# Patient Record
Sex: Male | Born: 1970 | Race: Black or African American | Hispanic: No | Marital: Married
Health system: Southern US, Community
[De-identification: ages and names within clinical notes are randomized; demographics above are authoritative.]

## PROBLEM LIST (undated history)

## (undated) DIAGNOSIS — M5410 Radiculopathy, site unspecified: Secondary | ICD-10-CM

## (undated) DIAGNOSIS — F411 Generalized anxiety disorder: Secondary | ICD-10-CM

---

## 2012-05-01 DIAGNOSIS — I471 Supraventricular tachycardia: Secondary | ICD-10-CM

## 2012-05-01 HISTORY — DX: Supraventricular tachycardia: I47.1

## 2017-05-01 HISTORY — PX: ROTATOR CUFF REPAIR: SHX139

## 2020-04-11 ENCOUNTER — Emergency Department (HOSPITAL_COMMUNITY)
Admission: EM | Admit: 2020-04-11 | Discharge: 2020-04-11 | Disposition: A | Discharge status: Home / Self Care | Attending: Emergency Medicine | Admitting: Emergency Medicine

## 2020-04-11 ENCOUNTER — Other Ambulatory Visit: Payer: Self-pay

## 2020-04-11 ENCOUNTER — Emergency Department (HOSPITAL_COMMUNITY)

## 2020-04-11 ENCOUNTER — Encounter (HOSPITAL_COMMUNITY): Payer: Self-pay

## 2020-04-11 DIAGNOSIS — G43109 Migraine with aura, not intractable, without status migrainosus: Secondary | ICD-10-CM | POA: Diagnosis not present

## 2020-04-11 DIAGNOSIS — Z20822 Contact with and (suspected) exposure to covid-19: Secondary | ICD-10-CM | POA: Insufficient documentation

## 2020-04-11 DIAGNOSIS — R7401 Elevation of levels of liver transaminase levels: Secondary | ICD-10-CM | POA: Diagnosis not present

## 2020-04-11 DIAGNOSIS — R030 Elevated blood-pressure reading, without diagnosis of hypertension: Secondary | ICD-10-CM | POA: Insufficient documentation

## 2020-04-11 DIAGNOSIS — R531 Weakness: Secondary | ICD-10-CM | POA: Insufficient documentation

## 2020-04-11 DIAGNOSIS — R202 Paresthesia of skin: Secondary | ICD-10-CM | POA: Insufficient documentation

## 2020-04-11 DIAGNOSIS — I639 Cerebral infarction, unspecified: Secondary | ICD-10-CM | POA: Insufficient documentation

## 2020-04-11 HISTORY — DX: Generalized anxiety disorder: F41.1

## 2020-04-11 HISTORY — DX: Radiculopathy, site unspecified: M54.10

## 2020-04-11 LAB — RESP PANEL BY RT-PCR (FLU A&B, COVID) ARPGX2
Influenza A by PCR: NEGATIVE
Influenza B by PCR: NEGATIVE
SARS Coronavirus 2 by RT PCR: NEGATIVE

## 2020-04-11 LAB — CBG MONITORING, ED: Glucose-Capillary: 116 mg/dL — ABNORMAL HIGH (ref 70–99)

## 2020-04-11 LAB — DIFFERENTIAL
Abs Immature Granulocytes: 0.01 10*3/uL (ref 0.00–0.07)
Basophils Absolute: 0.1 10*3/uL (ref 0.0–0.1)
Basophils Relative: 1 %
Eosinophils Absolute: 0 10*3/uL (ref 0.0–0.5)
Eosinophils Relative: 0 %
Immature Granulocytes: 0 %
Lymphocytes Relative: 60 %
Lymphs Abs: 3.3 10*3/uL (ref 0.7–4.0)
Monocytes Absolute: 0.5 10*3/uL (ref 0.1–1.0)
Monocytes Relative: 9 %
Neutro Abs: 1.7 10*3/uL (ref 1.7–7.7)
Neutrophils Relative %: 30 %

## 2020-04-11 LAB — COMPREHENSIVE METABOLIC PANEL
ALT: 73 U/L — ABNORMAL HIGH (ref 0–44)
AST: 46 U/L — ABNORMAL HIGH (ref 15–41)
Albumin: 4.5 g/dL (ref 3.5–5.0)
Alkaline Phosphatase: 45 U/L (ref 38–126)
Anion gap: 10 (ref 5–15)
BUN: 8 mg/dL (ref 6–20)
CO2: 26 mmol/L (ref 22–32)
Calcium: 9.4 mg/dL (ref 8.9–10.3)
Chloride: 101 mmol/L (ref 98–111)
Creatinine, Ser: 1.18 mg/dL (ref 0.61–1.24)
GFR, Estimated: >60 mL/min (ref >60)
Glucose, Bld: 118 mg/dL — ABNORMAL HIGH (ref 70–99)
Potassium: 3.9 mmol/L (ref 3.5–5.1)
Sodium: 137 mmol/L (ref 135–145)
Total Bilirubin: 0.7 mg/dL (ref 0.3–1.2)
Total Protein: 7.7 g/dL (ref 6.5–8.1)

## 2020-04-11 LAB — CBC
HCT: 44.7 % (ref 39.0–52.0)
Hemoglobin: 15 g/dL (ref 13.0–17.0)
MCH: 29.8 pg (ref 26.0–34.0)
MCHC: 33.6 g/dL (ref 30.0–36.0)
MCV: 88.7 fL (ref 80.0–100.0)
Platelets: 265 10*3/uL (ref 150–400)
RBC: 5.04 MIL/uL (ref 4.22–5.81)
RDW: 12.7 % (ref 11.5–15.5)
WBC: 5.6 10*3/uL (ref 4.0–10.5)
nRBC: 0 % (ref 0.0–0.2)

## 2020-04-11 LAB — I-STAT CHEM 8, ED
BUN: 9 mg/dL (ref 6–20)
Calcium, Ion: 1 mmol/L — ABNORMAL LOW (ref 1.15–1.40)
Chloride: 102 mmol/L (ref 98–111)
Creatinine, Ser: 1 mg/dL (ref 0.61–1.24)
Glucose, Bld: 115 mg/dL — ABNORMAL HIGH (ref 70–99)
HCT: 48 % (ref 39.0–52.0)
Hemoglobin: 16.3 g/dL (ref 13.0–17.0)
Potassium: 3.7 mmol/L (ref 3.5–5.1)
Sodium: 139 mmol/L (ref 135–145)
TCO2: 24 mmol/L (ref 22–32)

## 2020-04-11 LAB — PROTIME-INR
INR: 0.9 (ref 0.8–1.2)
Prothrombin Time: 11.9 seconds (ref 11.4–15.2)

## 2020-04-11 LAB — APTT: aPTT: 28 seconds (ref 24–36)

## 2020-04-11 MED ORDER — METOCLOPRAMIDE HCL 5 MG/ML IJ SOLN
10.0000 mg | Freq: Once | INTRAMUSCULAR | Status: AC
  Administered 2020-04-11: 10 mg via INTRAVENOUS
  Filled 2020-04-11: qty 2

## 2020-04-11 MED ORDER — SODIUM CHLORIDE 0.9 % IV BOLUS: 1000.0000 mL | Freq: Once | INTRAVENOUS | Status: DC

## 2020-04-11 MED ORDER — DIPHENHYDRAMINE HCL 50 MG/ML IJ SOLN
50.0000 mg | Freq: Once | INTRAMUSCULAR | Status: AC
  Administered 2020-04-11: 50 mg via INTRAVENOUS
  Filled 2020-04-11: qty 1

## 2020-04-11 MED ORDER — SODIUM CHLORIDE 0.9 % IV SOLN: INTRAVENOUS | Status: DC

## 2020-04-11 MED ORDER — SODIUM CHLORIDE 0.9% FLUSH
3.0000 mL | Freq: Once | INTRAVENOUS | Status: AC
  Administered 2020-04-11: 3 mL via INTRAVENOUS

## 2020-04-11 MED ORDER — LORAZEPAM 2 MG/ML IJ SOLN
1.0000 mg | Freq: Once | INTRAMUSCULAR | Status: DC
  Filled 2020-04-11: qty 1

## 2020-04-11 MED ORDER — KETOROLAC TROMETHAMINE 30 MG/ML IJ SOLN
30.0000 mg | Freq: Once | INTRAMUSCULAR | Status: AC
  Administered 2020-04-11: 30 mg via INTRAVENOUS
  Filled 2020-04-11: qty 1

## 2020-04-11 NOTE — ED Notes (Signed)
Patient verbalized understanding of discharge instructions. Opportunity for questions and answers.  

## 2020-04-11 NOTE — ED Notes (Signed)
Pt felt hot, flushed and was anxious after getting migraine cocktail. EDP made aware, order to ativan. When this RN walked in pt stated he was feeling much better and was ready to go home. EDP wants to monitor pt for another 30 mins.

## 2020-04-11 NOTE — ED Notes (Signed)
Patient transported to MRI 

## 2020-04-11 NOTE — Consult Note (Addendum)
Referring Physician: EMS/ER    Reason for Consult: code stroke  HPI: Matthew Russo is an 49 y.o. male with no PMH, presents with sudden onset of peri-oral numbness and tingling, followed by left arm/leg numbness. He states he first noted this around 10:30 am lasting only few seconds to minutes, but then it came and went a few more times, completely stopping between events. Then, since noon the last event has not stopped, so he went to an urgent care where EMS was called for stroke symptoms. On exam he still has very mild symptoms of left side numbness. MRI brain neg for stroke.   Date last known well: 04/11/20 Time last known well: 1200  tPA Given: mild symptoms; by time wk up done, he was outside of 3.5h time window  Past Medical History No past medical history on file.  Surgical History none  Family History  No family history on file.  Social History:   has no history on file for tobacco use, alcohol use, and drug use.  Allergies:  Not on File  Home Medications:  (Not in a hospital admission)   Hospital Medications . sodium chloride flush  3 mL Intravenous Once    ROS:  History obtained from pt  General ROS: negative for - chills, fatigue, fever, night sweats, weight gain or weight loss Psychological ROS: negative for - behavioral disorder, hallucinations, memory difficulties, mood swings or suicidal ideation Ophthalmic ROS: negative for - blurry vision, double vision, eye pain or loss of vision ENT ROS: negative for - epistaxis, nasal discharge, oral lesions, sore throat, tinnitus or vertigo Allergy and Immunology ROS: negative for - hives or itchy/watery eyes Hematological and Lymphatic ROS: negative for - bleeding problems, bruising or swollen lymph nodes Endocrine ROS: negative for - galactorrhea, hair pattern changes, polydipsia/polyuria or temperature intolerance Respiratory ROS: negative for - cough, hemoptysis, shortness of breath or wheezing Cardiovascular ROS:  negative for - chest pain, dyspnea on exertion, edema or irregular heartbeat Gastrointestinal ROS: negative for - abdominal pain, diarrhea, hematemesis, nausea/vomiting or stool incontinence Genito-Urinary ROS: negative for - dysuria, hematuria, incontinence or urinary frequency/urgency Musculoskeletal ROS: negative for - joint swelling or muscular weakness Neurological ROS: as noted in HPI Dermatological ROS: negative for rash and skin lesion changes   Physical Examination:  There were no vitals filed for this visit.  General: Appears well-developed; mild distress Psych: Affect appropriate to situation Eyes: No scleral injection HENT: No OP obstrucion Head: Normocephalic.  Cardiovascular: Normal rate and regular rhythm. Respiratory: Effort normal and breath sounds normal to anterior ascultation GI: Soft.  No distension. There is no tenderness.  Skin: WDI    Neurological Examination Mental Status: Alert, oriented, thought content appropriate.  Speech fluent without evidence of aphasia. Able to follow 3 step commands without difficulty. Cranial Nerves: II: Visual fields grossly normal,  III,IV, VI: ptosis not present, extra-ocular motions intact bilaterally, pupils equal, round, reactive to light and accommodation V,VII: smile symmetric, facial light touch sensation abnormal on left. VIII: hearing normal bilaterally IX,X: uvula rises symmetrically XI: bilateral shoulder shrug XII: midline tongue extension Motor: Right : Upper extremity   5/5    Left:     Upper extremity   5/5  Lower extremity   5/5     Lower extremity   5/5 Tone and bulk:normal tone throughout; no atrophy noted Sensory: Decreased on left arm/leg to light touch and cold temp Deep Tendon Reflexes: 2+ and symmetric throughout Plantars: Right: downgoing   Left: downgoing Cerebellar: normal  finger-to-nose, normal rapid alternating movements and normal heel-to-shin test Gait: did not test NIHSS 1a Level of  Conscious.:  1b LOC Questions:  1c LOC Commands:  2 Best Gaze:  3 Visual:  4 Facial Palsy:  5a Motor Arm - Left: 5b Motor Arm - Right:  6a Motor Leg - Left:  6b Motor Leg - Right:  7 Limb Ataxia:  8 Sensory: 1 9 Best Language:  10 Dysarthria:  11 Extinct. and Inatten.:  TOTAL: 1    LABORATORY STUDIES:  Basic Metabolic Panel: Recent Labs  Lab 04/11/20 1506  NA 139  K 3.7  CL 102  GLUCOSE 115*  BUN 9  CREATININE 1.00    Liver Function Tests: No results for input(s): AST, ALT, ALKPHOS, BILITOT, PROT, ALBUMIN in the last 168 hours. No results for input(s): LIPASE, AMYLASE in the last 168 hours. No results for input(s): AMMONIA in the last 168 hours.  CBC: Recent Labs  Lab 04/11/20 1506  HGB 16.3  HCT 48.0    Cardiac Enzymes: No results for input(s): CKTOTAL, CKMB, CKMBINDEX, TROPONINI in the last 168 hours.  BNP: Invalid input(s): POCBNP  CBG: Recent Labs  Lab 04/11/20 1500  GLUCAP 116*    Microbiology:   Coagulation Studies: No results for input(s): LABPROT, INR in the last 72 hours.  Urinalysis: No results for input(s): COLORURINE, LABSPEC, PHURINE, GLUCOSEU, HGBUR, BILIRUBINUR, KETONESUR, PROTEINUR, UROBILINOGEN, NITRITE, LEUKOCYTESUR in the last 168 hours.  Invalid input(s): APPERANCEUR  Lipid Panel:  No results found for: CHOL, TRIG, HDL, CHOLHDL, VLDL, LDLCALC  HgbA1C:  No results found for: HGBA1C  Urine Drug Screen:  No results found for: LABOPIA, COCAINSCRNUR, LABBENZ, AMPHETMU, THCU, LABBARB   Alcohol Level:  No results for input(s): ETH in the last 168 hours.  Miscellaneous labs:  ECG - SR rate   BPM. (See cardiology reading for complete details)   IMAGING: CT HEAD CODE STROKE WO CONTRAST  Result Date: 04/11/2020 CLINICAL DATA:  Code stroke. 49 year old male with left side weakness and numbness. EXAM: CT HEAD WITHOUT CONTRAST TECHNIQUE: Contiguous axial images were obtained from the base of the skull through the vertex  without intravenous contrast. COMPARISON:  None. FINDINGS: Brain: Mild symmetric dystrophic calcifications in the basal ganglia. Normal cerebral volume. No midline shift, ventriculomegaly, mass effect, evidence of mass lesion, intracranial hemorrhage or evidence of cortically based acute infarction. Gray-white matter differentiation is within normal limits throughout the brain. Vascular: Mild Calcified atherosclerosis at the skull base. No suspicious intracranial vascular hyperdensity. Skull: Negative. Sinuses/Orbits: Visualized paranasal sinuses and mastoids are clear. Other: Visualized orbits and scalp soft tissues are within normal limits. ASPECTS [facility] Stroke Program Early CT Score) Total score (0-10 with 10 being normal): 10 IMPRESSION: 1. Normal noncontrast CT appearance of the brain.  ASPECTS 10. 2. These results were communicated to Dr. Amada Jupiter at 3:15 pm on 04/11/2020 by text page via the [facility] messaging system. Electronically Signed   By: Odessa Fleming M.D.   On: 04/11/2020 15:16   Assessment:  Mr. Matthew Russo is a 49 y.o. male with no medical history presenting with stuttering symptoms of left side numbness. He did not receive IV t-PA due to mild symptoms and outside of time window by time work up done. Stat MRI showed no acute stroke. MRA head and neck wnl. Ddx is most likely a complex migraine.   1. Transient neurologic symptoms 2. Complex migraine 3. Elevated BP without dx of HTN- monitor, may need medication. Needs to f/u with PCP  Plan:  IV Migraine cocktail: Toradol 30mg , Benadryl 50mg , Reglan 10mg   If no relief, may repeat cocktail in 6h  IVF @ 75cc/h  Frequent neuro checks  Fall Precautions    Attending Neurologist's note to follow  Desiree Metzger-Cihelka, ARNP-C, ANVP-BC Pager: [phone]  I have seen the patient reviewed the above note.  With positive symptoms and history of migraine, I think that this is more likely to be complicated migraine aura without  headache versus transient ischemic attack.  Even if this were to represent transient ischemic attack, his ABCD two score is one.  The my index of suspicion for TIA is not high, baby aspirin is a relatively low risk intervention and therefore I would favor starting baby aspirin.  MRI was initially recommended, available at the time of finalizing this note.  No further recommendations at this time.  , MD [facility] [phone]  If 7pm- 7am, please page neurology on call as listed in AMION.

## 2020-04-11 NOTE — ED Provider Notes (Signed)
[facility]   CSN: [id number] Arrival date & time: 04/11/20  1458  An emergency department physician performed an initial assessment on this suspected stroke patient at [id number].  History Chief Complaint  Patient presents with  . Code Stroke    Matthew Russo is a 49 y.o. male with history of anxiety and SVT who presents as a code stroke.  Patient states that around noon (about 3 hours ago), he started experiencing perioral, tongue, left facial, LUE, and LLE numbness, tingling, and weakness. Tingling worse around his mouth and his left fingertips.  The symptoms seem to wax and wane without any clear aggravating or alleviating factors. He states the symptoms returned about 6 times but have resolved at this time. Airway intact, GCS 15.  The history is provided by the patient and the EMS personnel.  Neurologic Problem This is a new problem. The current episode started 3 to 5 hours ago. The problem has not changed since onset.Pertinent negatives include no chest pain, no abdominal pain, no headaches and no shortness of breath. Nothing aggravates the symptoms. Nothing relieves the symptoms. He has tried nothing for the symptoms.       Past Medical History:  Diagnosis Date  . Generalized anxiety disorder   . Radiculopathy    right shoulder  . SVT (supraventricular tachycardia) (HCC) 2014    There are no problems to display for this patient.   Past Surgical History:  Procedure Laterality Date  . ROTATOR CUFF REPAIR Left 2019       No family history on file.     Home Medications Prior to Admission medications   Not on File    Allergies    Patient has no allergy information on record.  Review of Systems   Review of Systems  Respiratory: Negative for shortness of breath.   Cardiovascular: Negative for chest pain.  Gastrointestinal: Negative for abdominal pain.  Neurological: Positive for weakness and numbness. Negative  for headaches.  All other systems reviewed and are negative.   Physical Exam Updated Vital Signs BP (!) 146/88 (BP Location: Right Arm)   Pulse 70   Temp 98 F (36.7 C) (Oral)   Resp 19   Ht 5\' 10"  (1.778 m)   Wt 112.6 kg   SpO2 99%   BMI 35.62 kg/m   Physical Exam Vitals and nursing note reviewed.  Constitutional:      General: He is not in acute distress.    Appearance: Normal appearance. He is well-developed and well-nourished. He is not ill-appearing or toxic-appearing.  HENT:     Head: Normocephalic and atraumatic.     Right Ear: External ear normal.     Left Ear: External ear normal.     Nose: Nose normal. No rhinorrhea.  Eyes:     Extraocular Movements: Extraocular movements intact.     Conjunctiva/sclera: Conjunctivae normal.     Pupils: Pupils are equal, round, and reactive to light.  Cardiovascular:     Rate and Rhythm: Normal rate and regular rhythm.     Heart sounds: No murmur heard.   Pulmonary:     Effort: Pulmonary effort is normal. No respiratory distress.     Breath sounds: Normal breath sounds.  Abdominal:     Palpations: Abdomen is soft.     Tenderness: There is no abdominal tenderness.  Musculoskeletal:        General: No edema.     Cervical back: Neck supple.  Skin:  General: Skin is warm and dry.  Neurological:     Mental Status: He is alert and oriented to person, place, and time. Mental status is at baseline.     Cranial Nerves: No cranial nerve deficit.     Sensory: No sensory deficit.     Motor: No weakness.  Psychiatric:        Mood and Affect: Mood and affect and mood normal.        Behavior: Behavior normal.     ED Results / Procedures / Treatments   Labs (all labs ordered are listed, but only abnormal results are displayed) Labs Reviewed  COMPREHENSIVE METABOLIC PANEL - Abnormal; Notable for the following components:      Result Value   Glucose, Bld 118 (*)    AST 46 (*)    ALT 73 (*)    All other components within  normal limits  I-STAT CHEM 8, ED - Abnormal; Notable for the following components:   Glucose, Bld 115 (*)    Calcium, Ion 1.00 (*)    All other components within normal limits  CBG MONITORING, ED - Abnormal; Notable for the following components:   Glucose-Capillary 116 (*)    All other components within normal limits  RESP PANEL BY RT-PCR (FLU A&B, COVID) ARPGX2  PROTIME-INR  APTT  CBC  DIFFERENTIAL    EKG None  Radiology MR ANGIO HEAD WO CONTRAST  Result Date: 04/11/2020 CLINICAL DATA:  49 year old male code stroke presentation today. Left side weakness and numbness. EXAM: MRA HEAD WITHOUT CONTRAST TECHNIQUE: Angiographic images of the Circle of Willis were obtained using MRA technique without intravenous contrast. COMPARISON:  Brain MRI and neck MRA today reported separately. FINDINGS: Antegrade flow in the posterior circulation with codominant distal vertebral arteries. Normal PICA origins. No distal vertebral or basilar stenosis. Normal SCA and PCA origins. Posterior communicating arteries are diminutive or absent. Bilateral PCA branches are within normal limits. Antegrade flow in both ICA siphons. No siphon stenosis. Ophthalmic artery origins appear normal. Patent carotid termini. Normal MCA and ACA origins. Diminutive or absent anterior communicating artery. Mildly tortuous ACA branches otherwise appear normal. Bilateral MCA M1 segments and MCA bifurcations are patent without stenosis. Visible bilateral MCA branches are within normal limits. IMPRESSION: Negative intracranial MRA. Electronically Signed   By: Odessa Fleming M.D.   On: 04/11/2020 17:34   MR ANGIO NECK WO CONTRAST  Result Date: 04/11/2020 CLINICAL DATA:  49 year old male code stroke presentation today. Left side weakness and numbness. EXAM: MRA NECK WITHOUT CONTRAST TECHNIQUE: Angiographic images of the neck were obtained using MRA technique without intravenous contrast. Carotid stenosis measurements (when applicable) are  obtained utilizing NASCET criteria, using the distal internal carotid diameter as the denominator. COMPARISON:  Brain MRI today, head CT reported separately. FINDINGS: Time-of-flight neck MRA imaging. Aortic arch is included with a slightly bovine arch configuration incidentally noted (normal variant). Antegrade flow in the right CCA. Right carotid bifurcation appears normal. Antegrade flow in the cervical right ICA to the skull base without evidence of stenosis. Antegrade flow in the left CCA, left carotid bifurcation, and cervical left ICA which appear within normal limits. Proximal subclavian arteries and both cervical vertebral arteries demonstrate antegrade flow without evidence of stenosis to the skull base. Codominant vertebral arteries. IMPRESSION: Negative Neck MRA. Electronically Signed   By: Odessa Fleming M.D.   On: 04/11/2020 17:31   MR BRAIN WO CONTRAST  Result Date: 04/11/2020 CLINICAL DATA:  49 year old male code stroke presentation today. Left side  weakness and numbness. EXAM: MRI HEAD WITHOUT CONTRAST TECHNIQUE: Multiplanar, multiecho pulse sequences of the brain and surrounding structures were obtained without intravenous contrast. COMPARISON:  Plain head CT 1508 hours today. FINDINGS: Brain: No restricted diffusion to suggest acute infarction. No midline shift, mass effect, evidence of mass lesion, ventriculomegaly, extra-axial collection or acute intracranial hemorrhage. Cervicomedullary junction and pituitary are within normal limits. Wallace Cullens and white matter signal is within normal limits for age throughout the brain. No cortical encephalomalacia or chronic cerebral blood products identified. Deep gray nuclei, brainstem and cerebellum appear normal. Vascular: Major intracranial vascular flow voids are preserved. See also MRA reported separately today. Skull and upper cervical spine: Negative. Sinuses/Orbits: Negative. Other: Trace bilateral mastoid air cell fluid. Negative visible nasopharynx.  Grossly normal visible internal auditory structures. Visible scalp and face appear negative. IMPRESSION: No acute intracranial abnormality, normal for age noncontrast MRI appearance of the brain. Electronically Signed   By: Odessa Fleming M.D.   On: 04/11/2020 17:29   CT HEAD CODE STROKE WO CONTRAST  Result Date: 04/11/2020 CLINICAL DATA:  Code stroke. 49 year old male with left side weakness and numbness. EXAM: CT HEAD WITHOUT CONTRAST TECHNIQUE: Contiguous axial images were obtained from the base of the skull through the vertex without intravenous contrast. COMPARISON:  None. FINDINGS: Brain: Mild symmetric dystrophic calcifications in the basal ganglia. Normal cerebral volume. No midline shift, ventriculomegaly, mass effect, evidence of mass lesion, intracranial hemorrhage or evidence of cortically based acute infarction. Gray-white matter differentiation is within normal limits throughout the brain. Vascular: Mild Calcified atherosclerosis at the skull base. No suspicious intracranial vascular hyperdensity. Skull: Negative. Sinuses/Orbits: Visualized paranasal sinuses and mastoids are clear. Other: Visualized orbits and scalp soft tissues are within normal limits. ASPECTS [facility] Stroke Program Early CT Score) Total score (0-10 with 10 being normal): 10 IMPRESSION: 1. Normal noncontrast CT appearance of the brain.  ASPECTS 10. 2. These results were communicated to Dr. Amada Jupiter at 3:15 pm on 04/11/2020 by text page via the [facility] messaging system. Electronically Signed   By: Odessa Fleming M.D.   On: 04/11/2020 15:16    Procedures Procedures (including critical care time)  Medications Ordered in ED Medications  0.9 %  sodium chloride infusion ( Intravenous Stopped 04/11/20 1950)  LORazepam (ATIVAN) injection 1 mg (1 mg Intravenous Not Given 04/11/20 1903)  sodium chloride 0.9 % bolus 1,000 mL (1,000 mLs Intravenous Refused 04/11/20 1934)  sodium chloride flush (NS) 0.9 % injection 3 mL (3 mLs Intravenous  Given 04/11/20 1523)  metoCLOPramide (REGLAN) injection 10 mg (10 mg Intravenous Given 04/11/20 1835)  ketorolac (TORADOL) 30 MG/ML injection 30 mg (30 mg Intravenous Given 04/11/20 1837)  diphenhydrAMINE (BENADRYL) injection 50 mg (50 mg Intravenous Given 04/11/20 1833)    ED Course  I have reviewed the triage vital signs and the nursing notes.  Pertinent labs & imaging results that were available during my care of the patient were reviewed by me and considered in my medical decision making (see chart for details).    MDM Rules/Calculators/A&P                          MDM: Matthew Russo is a 49 y.o. male who presents as Code Stroke as per above. I have reviewed the nursing documentation for past medical history, family history, and social history. Pertinent previous records reviewed. He is awake, alert. Mildly hypertensive to 167/96 but otherwise HDS. Afebrile. Physical exam is most notable for normal neuro exam.  Labs: glucose 116, slight transaminitis, Covid negative. EKG: NSR, nonspecific T wave changes. QTc, PR, and QRS within appropriate limits. No signs of acute ischemia, infarct, or significant electrical abnormalities. No STEMI, ST depressions, or significant T wave inversions. No evidence of a High-Grade Conduction Block, WPW, Brugada Sign, ARVC, DeWinters T Waves, or Wellens Waves. Imaging: CT head demonstrating no acute intracranial normality.  MRA head/neck and MRI brain with no acute findings. Consults: Neuro/Dr. Amada Jupiter Tx: Migraine cocktail with 50 mg IV Benadryl, 10 mg IV Reglan, 30 mg IV Toradol, and 1 L LR bolus.  Patient ordered 1 mg Ativan but never given.  Differential Dx: I am most concerned for migraine with aura. Given history, physical exam, and work-up, I do not think he has ischemic stroke, hemorrhagic stroke, vertebral/carotid artery dissection, electrolyte abnormality, or trauma.  MDM: Matthew Russo is a 49 y.o. male presents a code stroke due to perioral,  tongue, and left hemibody weakness, numbness, and tingling.  Still had mild symptoms when patient arrived to the CT scanner, neurology at bedside.  However, on reassessment and exam by me after CT head, patient stated that his symptoms had resolved.  EKG with nonspecific TW changes but not having chest pain, shortness of breath, or other symptoms concerning for cardiac etiology and thus do not feel that further work-up is indicated at this time.  Low concern for for stroke or TIA at this time.  Discussed patient's care with neurology who recommended migraine cocktail and discharging home on 81 mg daily ASA.  Per neurology, aspirin is a low risk intervention but still low concern for TIA.  Patient stable for discharge home, discussed strict return precautions, follow-up with neurology, and follow-up with PCP.  Patient and significant other bedside at bedside voiced understanding.  Patient was given migraine cocktail and had an episode of flushing.  Low concern for significant drug reaction, allergy, anaphylaxis at this time.  1 mg Ativan was ordered for the patient but never given.  Patient observed for an additional 30 minutes and on reassessment, overall well-appearing, asking to go home.  I felt this is reasonable and do not feel that further work-up is indicated this time.  Discharge stable condition.  Strict return precautions provided.  Encouraged him to follow-up with his PCP and neurology on outpatient basis.  All questions answered.  The plan for this patient was discussed with Dr. Silverio Lay, who voiced agreement and who oversaw evaluation and treatment of this patient.   Final Clinical Impression(s) / ED Diagnoses Final diagnoses:  Migraine with aura and without status migrainosus, not intractable    Rx / DC Orders ED Discharge Orders    None       Kelise Kuch, MD 04/12/20 0001    Charlynne Pander, MD 04/17/20 2019

## 2020-04-11 NOTE — Discharge Instructions (Signed)
Thank you for allowing [place] to take care of you.  If you have any numbness, tingling, weakness, headache, vision changes, chest pain, shortness of breath, or other concerning symptoms, come back to the ER or call 911.  We would recommend taking 81 mg of aspirin (a baby aspirin) daily unless otherwise specified by your PCP or neurologist.

## 2020-04-11 NOTE — ED Triage Notes (Signed)
Pt bib ems for code stroke, LSN 1200 today. Pt starting having numbness/tingling in his lips that progressed down to his left arm and left leg. Denies any weakness or headache. Pt arrives to ED alert and oriented. BP 170/90

## 2021-05-30 IMAGING — MR MR HEAD W/O CM
10 of 11 series · 43 of 48 positions shown · non-contrast
Comparison: Plain head CT 1135 hours today.

CLINICAL DATA: 49-year-old male code stroke presentation today.
Left side weakness and numbness.

EXAM:
MRI HEAD WITHOUT CONTRAST
TECHNIQUE: Multiplanar, multiecho pulse sequences of the brain and surrounding
structures were obtained without intravenous contrast.

[Series 5: DWI · axial · 3.0mm · 0.88mm/px · z∈[-84,+68]mm · 10 of 104 slices shown (1 of 4)]
[im 1/104]
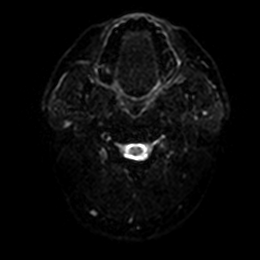
[im 12/104]
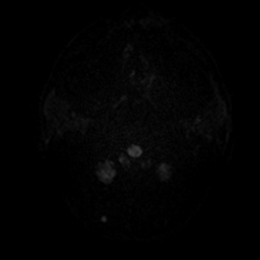
[im 23/104]
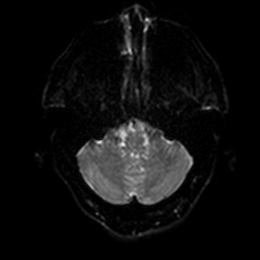
[im 35/104]
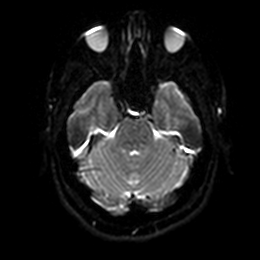
[im 46/104]
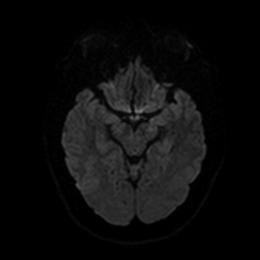
[im 58/104]
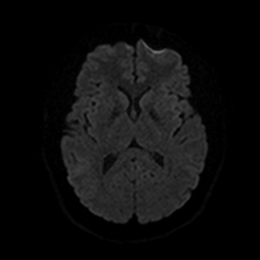
[im 69/104]
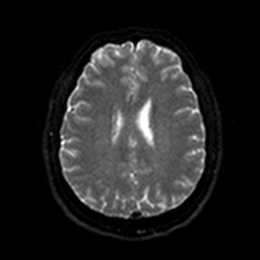
[im 81/104]
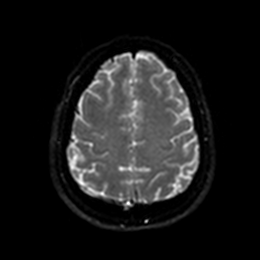
[im 92/104]
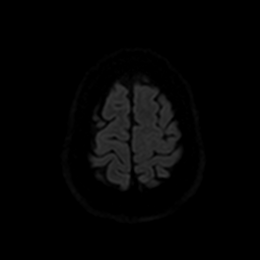
[im 104/104]
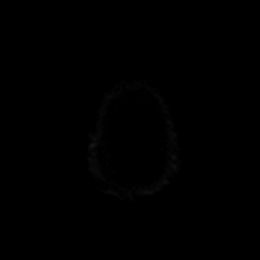

[Series 6: DWI · axial · 3.0mm · 0.88mm/px · z∈[-84,+68]mm · 5 of 52 slices shown (2 of 4)]
[im 1/52]
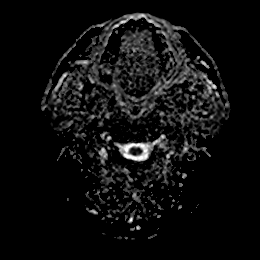
[im 13/52]
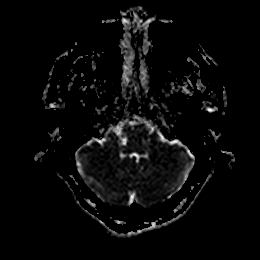
[im 26/52]
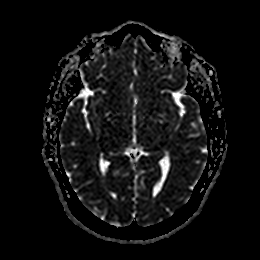
[im 39/52]
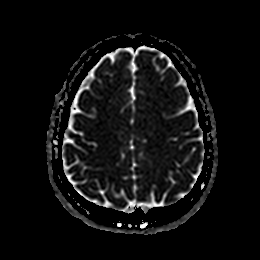
[im 52/52]
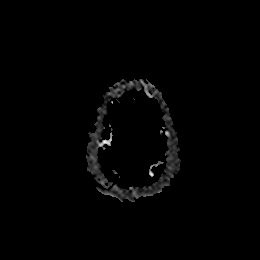

[Series 7: DWI · coronal · 4.0mm · 0.88mm/px · 6 of 72 slices shown (3 of 4)]
[im 1/72]
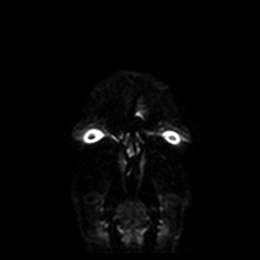
[im 15/72]
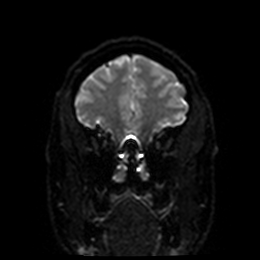
[im 29/72]
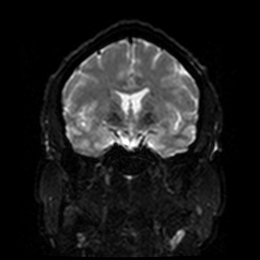
[im 43/72]
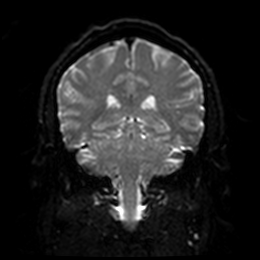
[im 57/72]
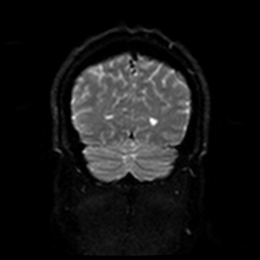
[im 72/72]
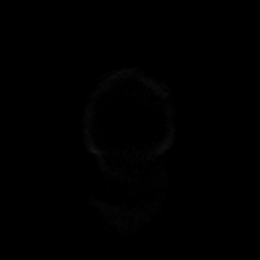

[Series 8: DWI · coronal · 4.0mm · 0.88mm/px · 3 of 36 slices shown (4 of 4)]
[im 1/36]
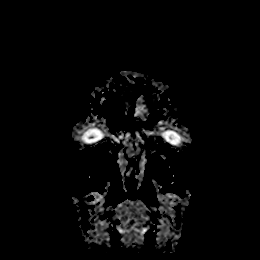
[im 18/36]
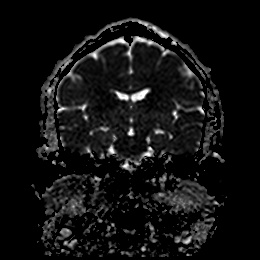
[im 36/36]
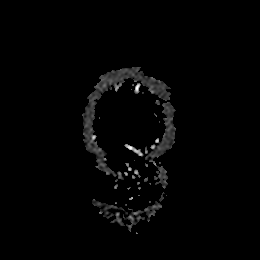

[Series 9: FLAIR · axial · 5.0mm · 0.45mm/px · z∈[-77,+67]mm · 2 of 25 slices shown]
[im 1/25]
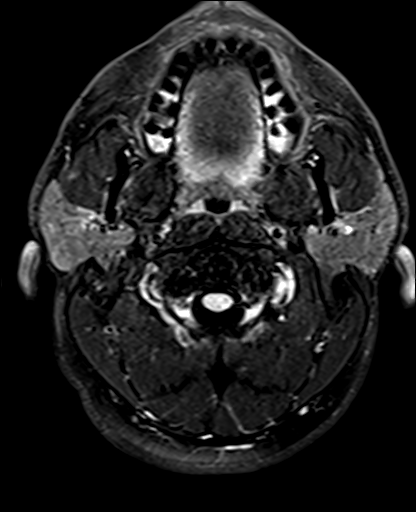
[im 25/25]
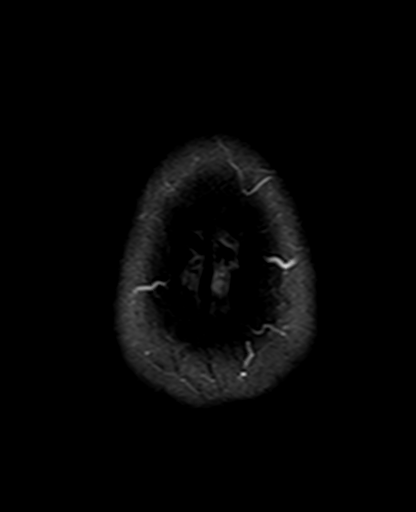

[Series 11: pha_images · axial · 3.0mm · 0.90mm/px · z∈[-87,+77]mm · 5 of 56 slices shown]
[im 1/56]
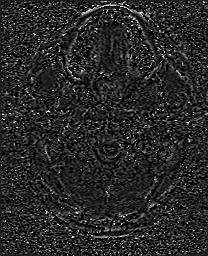
[im 14/56]
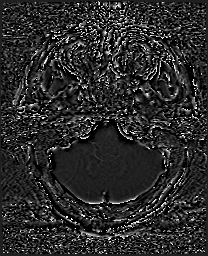
[im 28/56]
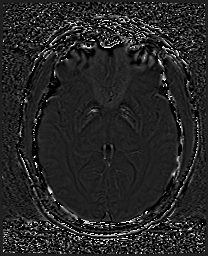
[im 42/56]
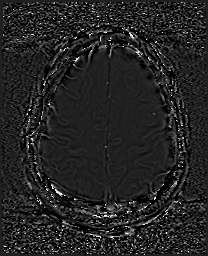
[im 56/56]
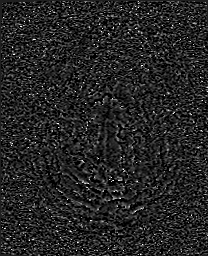

[Series 12: swi_images · axial · 3.0mm · 0.90mm/px · z∈[-87,+77]mm · 5 of 56 slices shown]
[im 1/56]
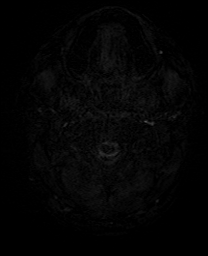
[im 14/56]
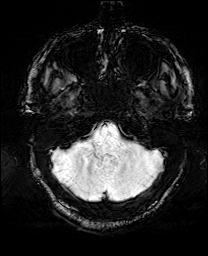
[im 28/56]
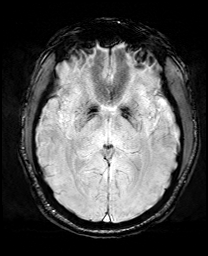
[im 42/56]
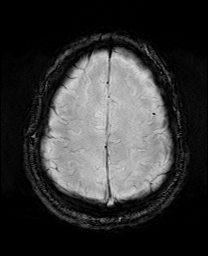
[im 56/56]
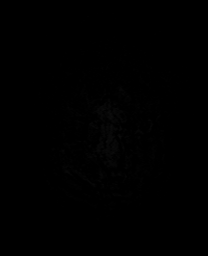

[Series 14: T1 · sagittal · 5.0mm · 0.75mm/px · 2 of 25 slices shown]
[im 1/25]
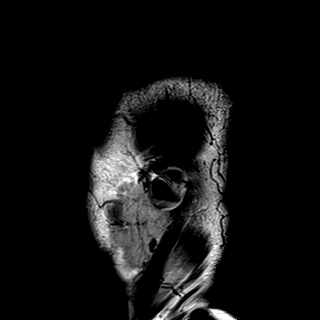
[im 25/25]
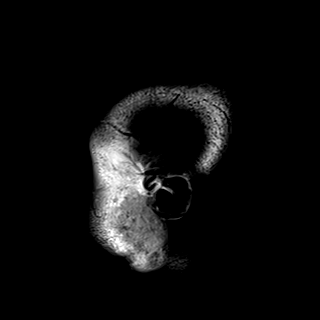

[Series 15: T2 · axial · 5.0mm · 0.72mm/px · z∈[-77,+66]mm · 2 of 25 slices shown (1 of 2)]
[im 1/25]
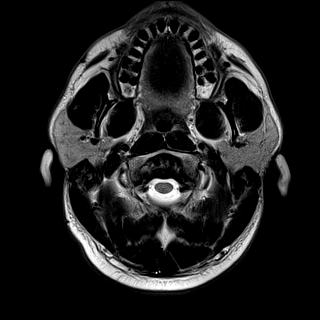
[im 25/25]
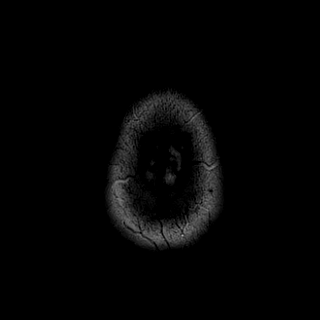

[Series 17: T2 · coronal · 5.0mm · 0.34mm/px · 3 of 30 slices shown (2 of 2)]
[im 1/30]
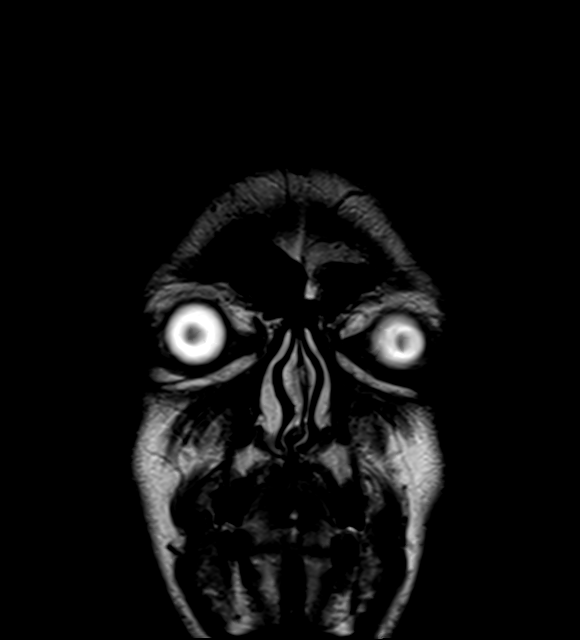
[im 15/30]
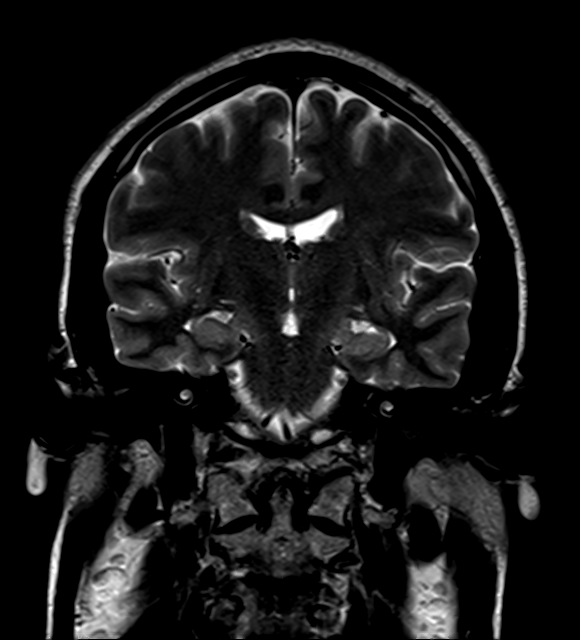
[im 30/30]
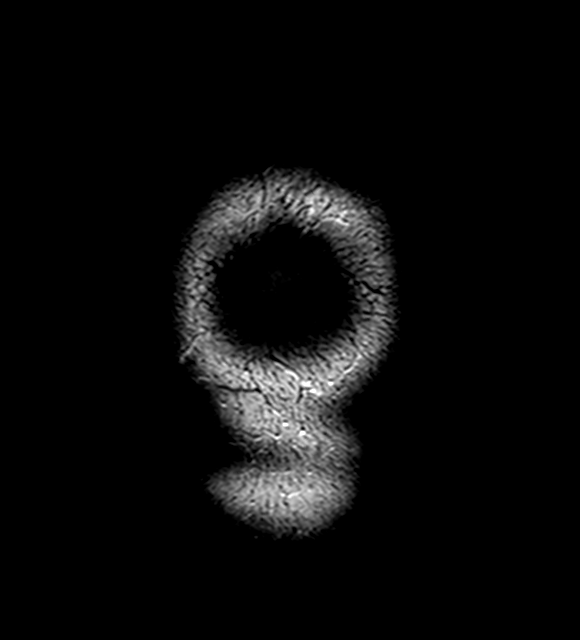

[43 of 48 positions shown; findings below may reference images not displayed]

FINDINGS: Brain: No restricted diffusion to suggest acute infarction. No
midline shift, mass effect, evidence of mass lesion,
ventriculomegaly, extra-axial collection or acute intracranial
hemorrhage. Cervicomedullary junction and pituitary are within
normal limits. Gray and white matter signal is within normal limits
for age throughout the brain. No cortical encephalomalacia or
chronic cerebral blood products identified. Deep gray nuclei,
brainstem and cerebellum appear normal.

Vascular: Major intracranial vascular flow voids are preserved. See
also MRA reported separately today.

Skull and upper cervical spine: Negative.

Sinuses/Orbits: Negative.

Other: Trace bilateral mastoid air cell fluid. Negative visible
nasopharynx. Grossly normal visible internal auditory structures.
Visible scalp and face appear negative.
IMPRESSION: No acute intracranial abnormality, normal for age noncontrast MRI
appearance of the brain.
